# Patient Record
Sex: Female | Born: 1980 | Race: Black or African American | Hispanic: No | Marital: Single | Smoking: Never smoker
Health system: Southern US, Community
[De-identification: ages and names within clinical notes are randomized; demographics above are authoritative.]

## PROBLEM LIST (undated history)

## (undated) DIAGNOSIS — T300 Burn of unspecified body region, unspecified degree: Secondary | ICD-10-CM

## (undated) HISTORY — PX: WISDOM TOOTH EXTRACTION: SHX21

## (undated) HISTORY — PX: TUBAL LIGATION: SHX77

## (undated) HISTORY — PX: OTHER SURGICAL HISTORY: SHX169

---

## 2010-08-07 ENCOUNTER — Emergency Department (HOSPITAL_BASED_OUTPATIENT_CLINIC_OR_DEPARTMENT_OTHER)
Admission: EM | Admit: 2010-08-07 | Discharge: 2010-08-07 | Disposition: A | Discharge status: Home / Self Care | Payer: Self-pay | Attending: Emergency Medicine | Admitting: Emergency Medicine

## 2010-08-07 DIAGNOSIS — R21 Rash and other nonspecific skin eruption: Secondary | ICD-10-CM | POA: Insufficient documentation

## 2010-08-07 DIAGNOSIS — S30860A Insect bite (nonvenomous) of lower back and pelvis, initial encounter: Secondary | ICD-10-CM | POA: Insufficient documentation

## 2010-08-07 DIAGNOSIS — W57XXXA Bitten or stung by nonvenomous insect and other nonvenomous arthropods, initial encounter: Secondary | ICD-10-CM | POA: Insufficient documentation

## 2012-07-07 ENCOUNTER — Encounter (HOSPITAL_COMMUNITY): Payer: Self-pay | Admitting: Emergency Medicine

## 2012-07-07 ENCOUNTER — Emergency Department (HOSPITAL_COMMUNITY)
Admission: EM | Admit: 2012-07-07 | Discharge: 2012-07-07 | Disposition: A | Discharge status: Home / Self Care | Payer: BC Managed Care – PPO | Attending: Emergency Medicine | Admitting: Emergency Medicine

## 2012-07-07 DIAGNOSIS — X12XXXA Contact with other hot fluids, initial encounter: Secondary | ICD-10-CM | POA: Insufficient documentation

## 2012-07-07 DIAGNOSIS — Y93G3 Activity, cooking and baking: Secondary | ICD-10-CM | POA: Insufficient documentation

## 2012-07-07 DIAGNOSIS — T2121XA Burn of second degree of chest wall, initial encounter: Secondary | ICD-10-CM | POA: Insufficient documentation

## 2012-07-07 DIAGNOSIS — Y929 Unspecified place or not applicable: Secondary | ICD-10-CM | POA: Insufficient documentation

## 2012-07-07 DIAGNOSIS — T300 Burn of unspecified body region, unspecified degree: Secondary | ICD-10-CM

## 2012-07-07 NOTE — ED Notes (Signed)
Last tetanus 32 yo
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
## 2012-07-07 NOTE — ED Provider Notes (Signed)
Medical screening examination/treatment/procedure(s) were performed by non-physician practitioner and as supervising physician I was immediately available for consultation/collaboration.  Salah Burlison L Jacques Fife, MD 07/07/12 1707 

## 2012-07-12 ENCOUNTER — Emergency Department (HOSPITAL_COMMUNITY)
Admission: EM | Admit: 2012-07-12 | Discharge: 2012-07-12 | Disposition: A | Discharge status: Home / Self Care | Payer: BC Managed Care – PPO | Attending: Emergency Medicine | Admitting: Emergency Medicine

## 2012-07-12 ENCOUNTER — Encounter (HOSPITAL_COMMUNITY): Payer: Self-pay | Admitting: *Deleted

## 2012-07-12 DIAGNOSIS — T2121XA Burn of second degree of chest wall, initial encounter: Secondary | ICD-10-CM | POA: Insufficient documentation

## 2012-07-12 DIAGNOSIS — Y929 Unspecified place or not applicable: Secondary | ICD-10-CM | POA: Insufficient documentation

## 2012-07-12 DIAGNOSIS — T300 Burn of unspecified body region, unspecified degree: Secondary | ICD-10-CM

## 2012-07-12 DIAGNOSIS — X088XXA Exposure to other specified smoke, fire and flames, initial encounter: Secondary | ICD-10-CM | POA: Insufficient documentation

## 2012-07-12 DIAGNOSIS — Z79899 Other long term (current) drug therapy: Secondary | ICD-10-CM | POA: Insufficient documentation

## 2012-07-12 DIAGNOSIS — Y93G3 Activity, cooking and baking: Secondary | ICD-10-CM | POA: Insufficient documentation

## 2012-07-12 MED ORDER — HYDROCODONE-ACETAMINOPHEN 5-325 MG PO TABS: ORAL_TABLET | ORAL | Status: DC

## 2012-07-12 MED ORDER — CEPHALEXIN 250 MG PO CAPS
500.0000 mg | ORAL_CAPSULE | Freq: Once | ORAL | Status: AC
  Administered 2012-07-12: 500 mg via ORAL
  Filled 2012-07-12: qty 2

## 2012-07-12 MED ORDER — CEPHALEXIN 500 MG PO CAPS: 500.0000 mg | ORAL_CAPSULE | Freq: Four times a day (QID) | ORAL | Status: DC

## 2012-07-12 MED ORDER — SILVER SULFADIAZINE 1 % EX CREA
TOPICAL_CREAM | Freq: Once | CUTANEOUS | Status: AC
  Administered 2012-07-12: 07:00:00 via TOPICAL
  Filled 2012-07-12: qty 85

## 2012-07-12 MED ORDER — NAPROXEN 500 MG PO TABS: 500.0000 mg | ORAL_TABLET | Freq: Two times a day (BID) | ORAL | Status: DC

## 2012-07-12 MED ORDER — DIPHENHYDRAMINE HCL 25 MG PO TABS: 25.0000 mg | ORAL_TABLET | Freq: Four times a day (QID) | ORAL | Status: DC
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
## 2012-07-12 NOTE — ED Provider Notes (Signed)
Medical screening examination/treatment/procedure(s) were performed by non-physician practitioner and as supervising physician I was immediately available for consultation/collaboration.  Caoilainn Sacks K Valdez Brannan-Rasch, MD 07/12/12 2333 

## 2012-07-12 NOTE — ED Notes (Signed)
Pt sleeping in the waiting room.

## 2012-07-14 ENCOUNTER — Encounter (HOSPITAL_COMMUNITY): Payer: Self-pay | Admitting: *Deleted

## 2012-07-14 ENCOUNTER — Emergency Department (HOSPITAL_COMMUNITY)
Admission: EM | Admit: 2012-07-14 | Discharge: 2012-07-14 | Disposition: A | Discharge status: Home / Self Care | Payer: BC Managed Care – PPO | Attending: Emergency Medicine | Admitting: Emergency Medicine

## 2012-07-14 DIAGNOSIS — Z5189 Encounter for other specified aftercare: Secondary | ICD-10-CM

## 2012-07-14 DIAGNOSIS — Z4800 Encounter for change or removal of nonsurgical wound dressing: Secondary | ICD-10-CM | POA: Insufficient documentation

## 2012-07-14 DIAGNOSIS — Z87828 Personal history of other (healed) physical injury and trauma: Secondary | ICD-10-CM | POA: Insufficient documentation

## 2012-07-14 DIAGNOSIS — B3789 Other sites of candidiasis: Secondary | ICD-10-CM | POA: Insufficient documentation

## 2012-07-14 DIAGNOSIS — R21 Rash and other nonspecific skin eruption: Secondary | ICD-10-CM | POA: Insufficient documentation

## 2012-07-14 HISTORY — DX: Burn of unspecified body region, unspecified degree: T30.0

## 2012-07-14 MED ORDER — NYSTATIN 100000 UNIT/GM EX POWD: Freq: Four times a day (QID) | CUTANEOUS | Status: DC

## 2012-07-14 MED ORDER — BACITRACIN ZINC 500 UNIT/GM EX OINT: TOPICAL_OINTMENT | Freq: Two times a day (BID) | CUTANEOUS | Status: DC

## 2012-07-14 MED ORDER — FLUCONAZOLE 100 MG PO TABS
200.0000 mg | ORAL_TABLET | Freq: Once | ORAL | Status: AC
  Administered 2012-07-14: 200 mg via ORAL
  Filled 2012-07-14: qty 2

## 2012-07-14 MED ORDER — CLINDAMYCIN HCL 150 MG PO CAPS
600.0000 mg | ORAL_CAPSULE | Freq: Once | ORAL | Status: AC
  Administered 2012-07-14: 600 mg via ORAL
  Filled 2012-07-14: qty 4
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
## 2012-07-14 NOTE — ED Notes (Signed)
Pt discharged.Vital signs stable and GCS 15 
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
## 2012-07-15 NOTE — ED Provider Notes (Signed)
Medical screening examination/treatment/procedure(s) were performed by non-physician practitioner and as supervising physician I was immediately available for consultation/collaboration.   Dione Booze, MD 07/15/12 [phone]

## 2012-07-16 ENCOUNTER — Encounter (HOSPITAL_COMMUNITY): Payer: Self-pay | Admitting: Emergency Medicine

## 2012-07-16 ENCOUNTER — Emergency Department (HOSPITAL_COMMUNITY)
Admission: EM | Admit: 2012-07-16 | Discharge: 2012-07-16 | Disposition: A | Discharge status: Home / Self Care | Payer: BC Managed Care – PPO | Attending: Emergency Medicine | Admitting: Emergency Medicine

## 2012-07-16 DIAGNOSIS — Z79899 Other long term (current) drug therapy: Secondary | ICD-10-CM | POA: Insufficient documentation

## 2012-07-16 DIAGNOSIS — T300 Burn of unspecified body region, unspecified degree: Secondary | ICD-10-CM

## 2012-07-16 DIAGNOSIS — T2121XA Burn of second degree of chest wall, initial encounter: Secondary | ICD-10-CM | POA: Insufficient documentation

## 2012-07-16 DIAGNOSIS — Y929 Unspecified place or not applicable: Secondary | ICD-10-CM | POA: Insufficient documentation

## 2012-07-16 DIAGNOSIS — L538 Other specified erythematous conditions: Secondary | ICD-10-CM | POA: Insufficient documentation

## 2012-07-16 DIAGNOSIS — Y999 Unspecified external cause status: Secondary | ICD-10-CM | POA: Insufficient documentation

## 2012-07-16 DIAGNOSIS — X19XXXA Contact with other heat and hot substances, initial encounter: Secondary | ICD-10-CM | POA: Insufficient documentation

## 2012-07-16 NOTE — ED Provider Notes (Signed)
Medical screening examination/treatment/procedure(s) were performed by non-physician practitioner and as supervising physician I was immediately available for consultation/collaboration.   Ailana Cuadrado, MD 07/16/12 2214 
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
## 2012-07-17 ENCOUNTER — Encounter (HOSPITAL_COMMUNITY): Payer: Self-pay | Admitting: Cardiology

## 2012-07-17 ENCOUNTER — Emergency Department (HOSPITAL_COMMUNITY)
Admission: EM | Admit: 2012-07-17 | Discharge: 2012-07-17 | Disposition: A | Discharge status: Home / Self Care | Payer: BC Managed Care – PPO | Attending: Emergency Medicine | Admitting: Emergency Medicine

## 2012-07-17 DIAGNOSIS — L299 Pruritus, unspecified: Secondary | ICD-10-CM

## 2012-07-17 DIAGNOSIS — Z87828 Personal history of other (healed) physical injury and trauma: Secondary | ICD-10-CM | POA: Insufficient documentation

## 2012-07-17 LAB — HEPATIC FUNCTION PANEL
Alkaline Phosphatase: 70 U/L (ref 39–117)
Total Protein: 7.5 g/dL (ref 6.0–8.3)

## 2012-07-17 MED ORDER — HYDROXYZINE HCL 50 MG PO TABS: 25.0000 mg | ORAL_TABLET | Freq: Two times a day (BID) | ORAL | Status: DC

## 2012-07-17 NOTE — ED Notes (Signed)
PA at bedside.
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
## 2012-07-17 NOTE — ED Notes (Signed)
Phlebotomy at bedside.
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
## 2012-07-20 NOTE — ED Provider Notes (Signed)
Medical screening examination/treatment/procedure(s) were performed by non-physician practitioner and as supervising physician I was immediately available for consultation/collaboration.   Charles B. Bernette Mayers, MD 07/20/12 1304

## 2013-02-17 ENCOUNTER — Emergency Department (HOSPITAL_COMMUNITY)
Admission: EM | Admit: 2013-02-17 | Discharge: 2013-02-17 | Disposition: A | Discharge status: Home / Self Care | Payer: BC Managed Care – PPO | Attending: Emergency Medicine | Admitting: Emergency Medicine

## 2013-02-17 ENCOUNTER — Encounter (HOSPITAL_COMMUNITY): Payer: Self-pay | Admitting: Emergency Medicine

## 2013-02-17 DIAGNOSIS — J111 Influenza due to unidentified influenza virus with other respiratory manifestations: Secondary | ICD-10-CM | POA: Insufficient documentation

## 2013-02-17 DIAGNOSIS — R112 Nausea with vomiting, unspecified: Secondary | ICD-10-CM | POA: Insufficient documentation

## 2013-02-17 DIAGNOSIS — R197 Diarrhea, unspecified: Secondary | ICD-10-CM | POA: Insufficient documentation

## 2013-02-17 DIAGNOSIS — Z79899 Other long term (current) drug therapy: Secondary | ICD-10-CM | POA: Insufficient documentation

## 2013-02-17 LAB — POCT I-STAT, CHEM 8
Calcium, Ion: 1.17 mmol/L (ref 1.12–1.23)
Chloride: 102 mEq/L (ref 96–112)
HCT: 43 % (ref 36.0–46.0)
Hemoglobin: 14.6 g/dL (ref 12.0–15.0)

## 2013-02-17 LAB — URINE MICROSCOPIC-ADD ON

## 2013-02-17 LAB — URINALYSIS, ROUTINE W REFLEX MICROSCOPIC
Ketones, ur: NEGATIVE mg/dL
Nitrite: NEGATIVE
Specific Gravity, Urine: 1.014 (ref 1.005–1.030)
pH: 5.5 (ref 5.0–8.0)

## 2013-02-17 MED ORDER — SODIUM CHLORIDE 0.9 % IV BOLUS (SEPSIS)
1000.0000 mL | Freq: Once | INTRAVENOUS | Status: AC
  Administered 2013-02-17: 1000 mL via INTRAVENOUS

## 2013-02-17 MED ORDER — BENZONATATE 100 MG PO CAPS: 200.0000 mg | ORAL_CAPSULE | Freq: Two times a day (BID) | ORAL | Status: DC | PRN

## 2013-02-17 MED ORDER — OSELTAMIVIR PHOSPHATE 75 MG PO CAPS: 75.0000 mg | ORAL_CAPSULE | Freq: Two times a day (BID) | ORAL | Status: DC

## 2013-02-17 MED ORDER — KETOROLAC TROMETHAMINE 30 MG/ML IJ SOLN: 30.0000 mg | Freq: Once | INTRAMUSCULAR | Status: DC

## 2013-02-17 NOTE — ED Notes (Signed)
Fever with general malaise since yesterday. Has cough with chest wall pain.

## 2013-02-17 NOTE — ED Provider Notes (Signed)
Medical screening examination/treatment/procedure(s) were performed by non-physician practitioner and as supervising physician I was immediately available for consultation/collaboration.  EKG Interpretation   None        Jeremaine Maraj T Pinkie Manger, MD 02/17/13 2352 
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
## 2013-02-19 LAB — URINE CULTURE

## 2013-02-20 ENCOUNTER — Telehealth (HOSPITAL_COMMUNITY): Payer: Self-pay | Admitting: Emergency Medicine

## 2013-02-20 NOTE — Progress Notes (Signed)
ED Antimicrobial Stewardship Positive Culture Follow Up   Chelsey Wells is an 32 y.o. female who presented to [facility] on 02/17/2013 with a chief complaint of  Chief Complaint  Patient presents with  . Cough    Recent Results (from the past 720 hour(s))  URINE CULTURE     Status: None   Collection Time    02/17/13  9:22 PM      Result Value Range Status   Specimen Description URINE, CLEAN CATCH   Final   Special Requests NONE   Final   Culture  Setup Time     Final   Value: 02/18/2013 01:44     Performed at [facility] Count     Final   Value: >=100,000 COLONIES/ML     Performed at [facility]   Culture     Final   Value: ESCHERICHIA COLI     Performed at [facility]   Report Status 02/19/2013 FINAL   Final   Organism ID, Bacteria ESCHERICHIA COLI   Final    [x]  Patient discharged originally without antimicrobial agent and treatment may now be indicated  Flow manager will attempt to contact patient for a symptom check. If she does not have UTI symptoms, no treatment is needed. If she has symptoms of UTI, start the prescription below.  New antibiotic prescription: Keflex 500mg  PO TID x 5 days  ED Provider: Trixie Dredge, PA-C   Sallee Provencal 02/20/2013, 11:48 AM Infectious Diseases Pharmacist Phone# [phone]

## 2013-02-20 NOTE — ED Notes (Signed)
Post ED Visit - Positive Culture Follow-up: Successful Patient Follow-Up  Culture assessed and recommendations reviewed by: []  Wes Dulaney, Pharm.D., BCPS []  Celedonio Miyamoto, Pharm.D., BCPS []  Georgina Pillion, Pharm.D., BCPS []  [place], [address].D., BCPS, AAHIVP [x]  Estella Husk, Pharm.D., BCPS, AAHIVP  Positive urine culture  [x]  Patient discharged without antimicrobial prescription and treatment is now indicated []  Organism is resistant to prescribed ED discharge antimicrobial []  Patient with positive blood cultures  Changes discussed with ED provider: Trixie Dredge PA-C New antibiotic prescription: if symptomatic, Keflex 500 mg PO TID x 5 days    [place], Jenel Lucks 02/20/2013, 1:00 PM

## 2013-05-30 ENCOUNTER — Encounter (HOSPITAL_COMMUNITY): Payer: Self-pay | Admitting: Emergency Medicine

## 2013-05-30 ENCOUNTER — Emergency Department (HOSPITAL_COMMUNITY)
Admission: EM | Admit: 2013-05-30 | Discharge: 2013-05-31 | Disposition: A | Discharge status: Home / Self Care | Payer: BC Managed Care – PPO | Attending: Emergency Medicine | Admitting: Emergency Medicine

## 2013-05-30 DIAGNOSIS — G43909 Migraine, unspecified, not intractable, without status migrainosus: Secondary | ICD-10-CM

## 2013-05-30 DIAGNOSIS — N390 Urinary tract infection, site not specified: Secondary | ICD-10-CM

## 2013-05-30 DIAGNOSIS — R112 Nausea with vomiting, unspecified: Secondary | ICD-10-CM | POA: Insufficient documentation

## 2013-05-30 DIAGNOSIS — H53149 Visual discomfort, unspecified: Secondary | ICD-10-CM | POA: Insufficient documentation

## 2013-05-30 DIAGNOSIS — H93239 Hyperacusis, unspecified ear: Secondary | ICD-10-CM | POA: Insufficient documentation

## 2013-05-30 MED ORDER — SODIUM CHLORIDE 0.9 % IV SOLN
1000.0000 mL | Freq: Once | INTRAVENOUS | Status: AC
  Administered 2013-05-31: 1000 mL via INTRAVENOUS

## 2013-05-30 MED ORDER — ONDANSETRON HCL 4 MG/2ML IJ SOLN
4.0000 mg | Freq: Once | INTRAMUSCULAR | Status: AC
  Administered 2013-05-31: 4 mg via INTRAVENOUS
  Filled 2013-05-30: qty 2

## 2013-05-30 NOTE — ED Notes (Addendum)
Pt reports a HA x3 days since Thursday afternoon, reports hx of HAs but this pain is different, pt has vomited, pressure behind her R eye, sensitivity to light and sounds, reports taking Bayer migraine without relief, family hx of migraines. Denies blurry vision. Pt a&o x4, skin warm and dry, ambulatory to triage.

## 2013-05-30 NOTE — ED Notes (Signed)
Bed: [id number] Expected date:  Expected time:  Means of arrival:  Comments: Tr 3

## 2013-05-31 LAB — COMPREHENSIVE METABOLIC PANEL
ALK PHOS: 75 U/L (ref 39–117)
ALT: 11 U/L (ref 0–35)
AST: 21 U/L (ref 0–37)
Albumin: 3.6 g/dL (ref 3.5–5.2)
BILIRUBIN TOTAL: 0.3 mg/dL (ref 0.3–1.2)
BUN: 10 mg/dL (ref 6–23)
CHLORIDE: 102 meq/L (ref 96–112)
CO2: 24 mEq/L (ref 19–32)
CREATININE: 0.9 mg/dL (ref 0.50–1.10)
Calcium: 9.3 mg/dL (ref 8.4–10.5)
GFR, EST NON AFRICAN AMERICAN: 82 mL/min — AB (ref >90)
GLUCOSE: 84 mg/dL (ref 70–99)
POTASSIUM: 3.7 meq/L (ref 3.7–5.3)
Sodium: 137 mEq/L (ref 137–147)
Total Protein: 7.7 g/dL (ref 6.0–8.3)

## 2013-05-31 LAB — CBC WITH DIFFERENTIAL/PLATELET
BASOS ABS: 0.1 10*3/uL (ref 0.0–0.1)
Basophils Relative: 1 % (ref 0–1)
EOS PCT: 1 % (ref 0–5)
Eosinophils Absolute: 0.1 10*3/uL (ref 0.0–0.7)
HCT: 40.5 % (ref 36.0–46.0)
Hemoglobin: 13.7 g/dL (ref 12.0–15.0)
LYMPHS PCT: 54 % — AB (ref 12–46)
Lymphs Abs: 3.5 10*3/uL (ref 0.7–4.0)
MCH: 28.8 pg (ref 26.0–34.0)
MCHC: 33.8 g/dL (ref 30.0–36.0)
MCV: 85.3 fL (ref 78.0–100.0)
Monocytes Absolute: 0.4 10*3/uL (ref 0.1–1.0)
Monocytes Relative: 7 % (ref 3–12)
NEUTROS ABS: 2.5 10*3/uL (ref 1.7–7.7)
NEUTROS PCT: 38 % — AB (ref 43–77)
PLATELETS: 294 10*3/uL (ref 150–400)
RBC: 4.75 MIL/uL (ref 3.87–5.11)
RDW: 13.1 % (ref 11.5–15.5)
WBC: 6.5 10*3/uL (ref 4.0–10.5)

## 2013-05-31 LAB — URINALYSIS, ROUTINE W REFLEX MICROSCOPIC
BILIRUBIN URINE: NEGATIVE
GLUCOSE, UA: NEGATIVE mg/dL
HGB URINE DIPSTICK: NEGATIVE
Ketones, ur: NEGATIVE mg/dL
Nitrite: POSITIVE — AB
Protein, ur: NEGATIVE mg/dL
Specific Gravity, Urine: 1.024 (ref 1.005–1.030)
UROBILINOGEN UA: 1 mg/dL (ref 0.0–1.0)
pH: 6 (ref 5.0–8.0)

## 2013-05-31 LAB — LIPASE, BLOOD: Lipase: 30 U/L (ref 11–59)

## 2013-05-31 LAB — URINE MICROSCOPIC-ADD ON

## 2013-05-31 MED ORDER — DIPHENHYDRAMINE HCL 50 MG/ML IJ SOLN
25.0000 mg | Freq: Once | INTRAMUSCULAR | Status: AC
  Administered 2013-05-31: 25 mg via INTRAVENOUS
  Filled 2013-05-31: qty 1

## 2013-05-31 MED ORDER — PROCHLORPERAZINE EDISYLATE 5 MG/ML IJ SOLN
10.0000 mg | Freq: Once | INTRAMUSCULAR | Status: AC
  Administered 2013-05-31: 10 mg via INTRAVENOUS
  Filled 2013-05-31: qty 2

## 2013-05-31 MED ORDER — NITROFURANTOIN MONOHYD MACRO 100 MG PO CAPS: 100.0000 mg | ORAL_CAPSULE | Freq: Two times a day (BID) | ORAL | Status: DC

## 2013-05-31 MED ORDER — KETOROLAC TROMETHAMINE 30 MG/ML IJ SOLN
30.0000 mg | Freq: Once | INTRAMUSCULAR | Status: AC
  Administered 2013-05-31: 30 mg via INTRAVENOUS
  Filled 2013-05-31: qty 1
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
## 2013-05-31 NOTE — Discharge Instructions (Signed)
Return here as needed.  Follow-up with a primary care Dr. increase your fluid intake °

## 2013-06-01 NOTE — ED Provider Notes (Signed)
Medical screening examination/treatment/procedure(s) were performed by non-physician practitioner and as supervising physician I was immediately available for consultation/collaboration.   EKG Interpretation None       Derwood KaplanAnkit Genevra Orne, MD 06/01/13 1009

## 2013-08-02 ENCOUNTER — Encounter (HOSPITAL_COMMUNITY): Payer: Self-pay | Admitting: Emergency Medicine

## 2013-08-02 ENCOUNTER — Emergency Department (HOSPITAL_COMMUNITY)
Admission: EM | Admit: 2013-08-02 | Discharge: 2013-08-02 | Disposition: A | Discharge status: Home / Self Care | Payer: BC Managed Care – PPO | Attending: Emergency Medicine | Admitting: Emergency Medicine

## 2013-08-02 DIAGNOSIS — M79609 Pain in unspecified limb: Secondary | ICD-10-CM

## 2013-08-02 DIAGNOSIS — Z9889 Other specified postprocedural states: Secondary | ICD-10-CM | POA: Insufficient documentation

## 2013-08-02 DIAGNOSIS — Z87828 Personal history of other (healed) physical injury and trauma: Secondary | ICD-10-CM | POA: Insufficient documentation

## 2013-08-02 DIAGNOSIS — M79604 Pain in right leg: Secondary | ICD-10-CM

## 2013-08-02 DIAGNOSIS — Z7982 Long term (current) use of aspirin: Secondary | ICD-10-CM | POA: Insufficient documentation

## 2013-08-02 MED ORDER — KETOROLAC TROMETHAMINE 60 MG/2ML IM SOLN
30.0000 mg | Freq: Once | INTRAMUSCULAR | Status: AC
  Administered 2013-08-02: 30 mg via INTRAMUSCULAR
  Filled 2013-08-02: qty 2

## 2013-08-02 MED ORDER — HYDROCODONE-ACETAMINOPHEN 5-325 MG PO TABS: ORAL_TABLET | ORAL | Status: DC
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
## 2013-08-02 NOTE — Discharge Instructions (Signed)
Rest, Ice intermittently (in the first 24-48 hours), Gentle compression with an Ace wrap, and elevate (Limb above the level of the heart)   Take up to 800mg  of ibuprofen (that is usually 4 over the counter pills)  3 times a day for 5 days. Take with food.  Take vicodin for breakthrough pain, do not drink alcohol, drive, care for children or do other critical tasks while taking vicodin.  Do not hesitate to return to the Emergency Department for any new, worsening or concerning symptoms.   If you do not have a primary care doctor you can establish one at the   [facility]: [address] [place] [address] [phone]  After you establish care. Let them know you were seen in the emergency room. They must obtain records for further management.    Musculoskeletal Pain Musculoskeletal pain is muscle and boney aches and pains. These pains can occur in any part of the body. Your caregiver may treat you without knowing the cause of the pain. They may treat you if blood or urine tests, X-rays, and other tests were normal.  CAUSES There is often not a definite cause or reason for these pains. These pains may be caused by a type of germ (virus). The discomfort may also come from overuse. Overuse includes working out too hard when your body is not fit. Boney aches also come from weather changes. Bone is sensitive to atmospheric pressure changes. HOME CARE INSTRUCTIONS   Ask when your test results will be ready. Make sure you get your test results.  Only take over-the-counter or prescription medicines for pain, discomfort, or fever as directed by your caregiver. If you were given medications for your condition, do not drive, operate machinery or power tools, or sign legal documents for 24 hours. Do not drink alcohol. Do not take sleeping pills or other medications that may interfere with treatment.  Continue all activities unless the activities cause more pain. When the pain  lessens, slowly resume normal activities. Gradually increase the intensity and duration of the activities or exercise.  During periods of severe pain, bed rest may be helpful. Lay or sit in any position that is comfortable.  Putting ice on the injured area.  Put ice in a bag.  Place a towel between your skin and the bag.  Leave the ice on for 15 to 20 minutes, 3 to 4 times a day.  Follow up with your caregiver for continued problems and no reason can be found for the pain. If the pain becomes worse or does not go away, it may be necessary to repeat tests or do additional testing. Your caregiver may need to look further for a possible cause. SEEK IMMEDIATE MEDICAL CARE IF:  You have pain that is getting worse and is not relieved by medications.  You develop chest pain that is associated with shortness or breath, sweating, feeling sick to your stomach (nauseous), or throw up (vomit).  Your pain becomes localized to the abdomen.  You develop any new symptoms that seem different or that concern you. MAKE SURE YOU:   Understand these instructions.  Will watch your condition.  Will get help right away if you are not doing well or get worse. Document Released: 02/11/2005 Document Revised: 05/06/2011 Document Reviewed: 10/16/2012 [facility] Patient Information 2014 [place], [place].

## 2013-08-02 NOTE — ED Notes (Signed)
Pt c/o right lower leg pain x 1 week, denies injuries, hx of blood clots or SOB.

## 2013-08-02 NOTE — ED Provider Notes (Signed)
Medical screening examination/treatment/procedure(s) were performed by non-physician practitioner and as supervising physician I was immediately available for consultation/collaboration.  Flint Melter, MD 08/02/13 [phone]

## 2013-08-02 NOTE — Progress Notes (Signed)
VASCULAR LAB PRELIMINARY  PRELIMINARY  PRELIMINARY  PRELIMINARY  Right lower extremity venous duplex completed.    Preliminary report:  Right:  No evidence of DVT, superficial thrombosis, or Baker's cyst.  Gara Kroner, RVT 08/02/2013, 9:48 AM

## 2014-02-28 ENCOUNTER — Ambulatory Visit (INDEPENDENT_AMBULATORY_CARE_PROVIDER_SITE_OTHER): Payer: BLUE CROSS/BLUE SHIELD

## 2014-02-28 ENCOUNTER — Encounter: Payer: Self-pay | Admitting: Podiatry

## 2014-02-28 ENCOUNTER — Ambulatory Visit (INDEPENDENT_AMBULATORY_CARE_PROVIDER_SITE_OTHER): Payer: BLUE CROSS/BLUE SHIELD | Admitting: Podiatry

## 2014-02-28 VITALS — BP 118/65 | HR 74 | Resp 13 | Ht <= 58 in | Wt 164.0 lb

## 2014-02-28 DIAGNOSIS — M79661 Pain in right lower leg: Secondary | ICD-10-CM

## 2014-02-28 DIAGNOSIS — M21371 Foot drop, right foot: Secondary | ICD-10-CM

## 2014-02-28 DIAGNOSIS — R269 Unspecified abnormalities of gait and mobility: Secondary | ICD-10-CM

## 2014-02-28 DIAGNOSIS — L84 Corns and callosities: Secondary | ICD-10-CM

## 2014-02-28 DIAGNOSIS — Q72891 Other reduction defects of right lower limb: Secondary | ICD-10-CM

## 2014-02-28 NOTE — Progress Notes (Signed)
   Subjective:    Patient ID: Chelsey Wells, female    DOB: 1980-08-26, 34 y.o.   MRN: [id number]  HPI Comments: N callouses L right 1st toe and plantar sub 4, 5th toe D and O long-term C hard painful skin A walking  T hx of pedicures on and off as needed  Pt complains of burning pain in right shin and request referral to an orthopedic doctor.  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 She describes approximately nine operations to the lower leg ankle and foot on the right. She has worn shoe lifts, AFOs, however, now has an over-the-counter soft heel cushion lift in her right shoe. 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  All other systems reviewed and are negative.      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 Keratoses plantar right hallux, lateral fifth right toe Plantar keratoses first and third MPJ left  Musculoskeletal: Right lower leg is smaller than left Right lower extremity shorter than left 10 mm left placed on the right foot provide discomfort to patient Manual motor testing: Plantar flexion right 4/5 left 5/5 Dorsi flexion right 0/5 left 5/5 Inversion right 0/5 left 5/5 Eversion right 0/5 left 5/5  No motion in right subtalar joint, no restriction left subtalar joint Right calcaneocuboid joint appears fused no restriction left subtalar joint Able manually dorsiflex right ankle without obvious restriction or crepitus Patient  seems to have a relatively stable gait  X-ray examination right foot  Intact bony structure without fracture and/or dislocation Medial transverse drifting of the lateral digits Internal fixation hallux, first metatarsal, talonavicular, calcaneocuboid Apparent fusion of subtalar joint and calcaneocuboid joint and interphalangeal joint of the hallux Bone resection fifth toe  Radiographic impression: Evidence of surgical fusion subtalar joint, calcaneocuboid joint, hallux interphalangeal joint appear well-healed    X-ray examination right ankle  Intact bony structure without fracture or dislocation Retained internal fixation talonavicular calcaneal cuboid area and first metatarsal Arthrodesis of the subtalar and calcaneocuboid joints Ankle joint is visible with dorsal spurring on the head and neck of the talus  Radiographic impression right ankle: Ankle joint is visible without obvious arthritic changes Evidence of rear foot fusion of the subtalar and calcaneocuboid joints     Assessment & Plan:   Assessment: 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 We did discuss AFO device, however, patient was not interested in this type of device Return as needed for debridement and further evaluation at patient's request

## 2014-07-13 ENCOUNTER — Emergency Department (HOSPITAL_COMMUNITY): Payer: BLUE CROSS/BLUE SHIELD

## 2014-07-13 ENCOUNTER — Emergency Department (HOSPITAL_COMMUNITY)
Admission: EM | Admit: 2014-07-13 | Discharge: 2014-07-13 | Disposition: A | Discharge status: Home / Self Care | Payer: BLUE CROSS/BLUE SHIELD | Attending: Emergency Medicine | Admitting: Emergency Medicine

## 2014-07-13 ENCOUNTER — Encounter (HOSPITAL_COMMUNITY): Payer: Self-pay

## 2014-07-13 DIAGNOSIS — Z3202 Encounter for pregnancy test, result negative: Secondary | ICD-10-CM | POA: Diagnosis not present

## 2014-07-13 DIAGNOSIS — Z87828 Personal history of other (healed) physical injury and trauma: Secondary | ICD-10-CM | POA: Insufficient documentation

## 2014-07-13 DIAGNOSIS — R079 Chest pain, unspecified: Secondary | ICD-10-CM | POA: Diagnosis not present

## 2014-07-13 DIAGNOSIS — Z7982 Long term (current) use of aspirin: Secondary | ICD-10-CM | POA: Insufficient documentation

## 2014-07-13 DIAGNOSIS — M79606 Pain in leg, unspecified: Secondary | ICD-10-CM | POA: Insufficient documentation

## 2014-07-13 DIAGNOSIS — R110 Nausea: Secondary | ICD-10-CM | POA: Diagnosis not present

## 2014-07-13 DIAGNOSIS — R0602 Shortness of breath: Secondary | ICD-10-CM | POA: Diagnosis not present

## 2014-07-13 LAB — CBC
HEMATOCRIT: 41.7 % (ref 36.0–46.0)
Hemoglobin: 13.8 g/dL (ref 12.0–15.0)
MCH: 27.9 pg (ref 26.0–34.0)
MCHC: 33.1 g/dL (ref 30.0–36.0)
MCV: 84.2 fL (ref 78.0–100.0)
PLATELETS: 294 10*3/uL (ref 150–400)
RBC: 4.95 MIL/uL (ref 3.87–5.11)
RDW: 13.2 % (ref 11.5–15.5)
WBC: 6.1 10*3/uL (ref 4.0–10.5)

## 2014-07-13 LAB — I-STAT TROPONIN, ED
TROPONIN I, POC: 0 ng/mL (ref 0.00–0.08)
Troponin i, poc: 0 ng/mL (ref 0.00–0.08)

## 2014-07-13 LAB — BASIC METABOLIC PANEL
Anion gap: 10 (ref 5–15)
BUN: 13 mg/dL (ref 6–20)
CHLORIDE: 104 mmol/L (ref 101–111)
CO2: 25 mmol/L (ref 22–32)
Calcium: 9.3 mg/dL (ref 8.9–10.3)
Creatinine, Ser: 0.95 mg/dL (ref 0.44–1.00)
GFR calc non Af Amer: >60 mL/min (ref >60)
Glucose, Bld: 76 mg/dL (ref 65–99)
POTASSIUM: 3.9 mmol/L (ref 3.5–5.1)
Sodium: 139 mmol/L (ref 135–145)

## 2014-07-13 LAB — URINALYSIS, ROUTINE W REFLEX MICROSCOPIC
BILIRUBIN URINE: NEGATIVE
Glucose, UA: NEGATIVE mg/dL
Hgb urine dipstick: NEGATIVE
KETONES UR: NEGATIVE mg/dL
Leukocytes, UA: NEGATIVE
NITRITE: POSITIVE — AB
PH: 6.5 (ref 5.0–8.0)
PROTEIN: NEGATIVE mg/dL
Specific Gravity, Urine: 1.023 (ref 1.005–1.030)
Urobilinogen, UA: 2 mg/dL — ABNORMAL HIGH (ref 0.0–1.0)

## 2014-07-13 LAB — URINE MICROSCOPIC-ADD ON

## 2014-07-13 LAB — D-DIMER, QUANTITATIVE (NOT AT ARMC): D-Dimer, Quant: 0.43 ug/mL-FEU (ref 0.00–0.48)

## 2014-07-13 LAB — POC URINE PREG, ED: Preg Test, Ur: NEGATIVE

## 2014-07-13 MED ORDER — KETOROLAC TROMETHAMINE 30 MG/ML IJ SOLN
30.0000 mg | Freq: Once | INTRAMUSCULAR | Status: AC
  Administered 2014-07-13: 30 mg via INTRAVENOUS
  Filled 2014-07-13: qty 1

## 2014-07-13 MED ORDER — GI COCKTAIL ~~LOC~~
30.0000 mL | Freq: Once | ORAL | Status: AC
  Administered 2014-07-13: 30 mL via ORAL
  Filled 2014-07-13: qty 30

## 2014-07-13 NOTE — ED Notes (Signed)
Pt presents with c/o central chest pain that started this morning, tightness in nature. Pt reports she has had a hx of this before, has had to wear a heart monitor in the past. Pt also c/o left leg pain, denies any injury.
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
## 2014-07-13 NOTE — Discharge Instructions (Signed)

## 2014-11-02 ENCOUNTER — Ambulatory Visit (INDEPENDENT_AMBULATORY_CARE_PROVIDER_SITE_OTHER): Payer: BLUE CROSS/BLUE SHIELD | Admitting: Podiatry

## 2014-11-02 ENCOUNTER — Encounter: Payer: Self-pay | Admitting: Podiatry

## 2014-11-02 VITALS — BP 112/70 | HR 99 | Ht <= 58 in | Wt 172.0 lb

## 2014-11-02 DIAGNOSIS — M79673 Pain in unspecified foot: Secondary | ICD-10-CM

## 2014-11-02 DIAGNOSIS — M79606 Pain in leg, unspecified: Secondary | ICD-10-CM | POA: Insufficient documentation

## 2014-11-02 DIAGNOSIS — M21969 Unspecified acquired deformity of unspecified lower leg: Secondary | ICD-10-CM | POA: Diagnosis not present

## 2014-11-02 DIAGNOSIS — M205X9 Other deformities of toe(s) (acquired), unspecified foot: Secondary | ICD-10-CM

## 2014-11-02 DIAGNOSIS — Q828 Other specified congenital malformations of skin: Secondary | ICD-10-CM | POA: Insufficient documentation

## 2014-11-02 DIAGNOSIS — M205X1 Other deformities of toe(s) (acquired), right foot: Secondary | ICD-10-CM

## 2014-11-02 DIAGNOSIS — M79604 Pain in right leg: Secondary | ICD-10-CM

## 2014-11-02 NOTE — Progress Notes (Signed)
SUBJECTIVE: 34 y.o. year old female presents stating that she was having problem with right foot all her life. This time the foot has been hurting for a month. Pain on the tip of right great toe and side of the 5th MPJ area right foot where hard callus like tissue build up.  Stated that she has had lengthening procedure on 5th toe right in 2007. She has a pair of shoe inserts that are not providing sufficient support. She feels she need something more sturdy.    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   OBJECTIVE: DERMATOLOGIC EXAMINATION: Nails: normal appearing nails bilaterally Positive of thick callus like build up under the 5th MPJ area right foot.  Distal end of right hallux with thick callused skin.  Positive of multiple scar over right foot from old surgeries.  VASCULAR EXAMINATION OF LOWER LIMBS: Pedal pulses: All pedal pulses are palpable with normal pulsation.  Temperature gradient from tibial crest to dorsum of foot is within normal bilateral. No edema or erythema noted. NEUROLOGIC EXAMINATION OF THE LOWER LIMBS: Achilles DTR is present and within normal. All epicritic and tactile sensations grossly intact. Sharp and Dull discriminatory sensations at the plantar ball of hallux is intact bilateral.  MUSCULOSKELETAL EXAMINATION: Positive for plantar grading right hallux with dorsally displaced first MPJ right foot.  Excess lateral weight shifting right foot.  Long right hallux with plantar flexion at the distal end upon weight bearing.  Limited dorsiflexion of the first MPJ right with load bearing.   RADIOGRAPHIC FINDINGS: Right foot has had rearfoot fusion, base wedge osteotomy with screw fixation of the first Metatarsal, first IPJ fusion right hallux.  Elevated first metatarsal on on lateral view with long first metatarsal right foot.  Left foot has  normal findings.  ASSESSMENT: 1. Long and elevated first ray fail to bear load. 2. Extended long right hallux plantar flexion upon weight bearing with build up callus. 3. Lateral weight shifting due to dorsiflexed first ray right.  4. Painful plantar lateral callus right.  5. Hallux limitus right.  PLAN: Reviewed findings and available treatment options. All lesions debrided. Both feet casted for orthotics.

## 2014-12-01 ENCOUNTER — Ambulatory Visit: Payer: BLUE CROSS/BLUE SHIELD | Admitting: Podiatry

## 2014-12-02 ENCOUNTER — Ambulatory Visit: Payer: BLUE CROSS/BLUE SHIELD | Admitting: Podiatry

## 2015-08-08 ENCOUNTER — Encounter (HOSPITAL_COMMUNITY): Payer: Self-pay | Admitting: *Deleted

## 2015-08-08 ENCOUNTER — Emergency Department (HOSPITAL_COMMUNITY)
Admission: EM | Admit: 2015-08-08 | Discharge: 2015-08-08 | Disposition: A | Discharge status: Home / Self Care | Payer: Self-pay | Attending: Emergency Medicine | Admitting: Emergency Medicine

## 2015-08-08 ENCOUNTER — Emergency Department (HOSPITAL_COMMUNITY): Payer: Self-pay

## 2015-08-08 DIAGNOSIS — B349 Viral infection, unspecified: Secondary | ICD-10-CM | POA: Insufficient documentation

## 2015-08-08 LAB — RAPID STREP SCREEN (MED CTR MEBANE ONLY): STREPTOCOCCUS, GROUP A SCREEN (DIRECT): NEGATIVE

## 2015-08-08 MED ORDER — ACETAMINOPHEN 325 MG PO TABS: 650.0000 mg | ORAL_TABLET | Freq: Once | ORAL | Status: DC | PRN

## 2015-08-08 NOTE — Discharge Instructions (Signed)
Viral Infections °A viral infection can be caused by different types of viruses. Most viral infections are not serious and resolve on their own. However, some infections may cause severe symptoms and may lead to further complications. °SYMPTOMS °Viruses can frequently cause: °· Minor sore throat. °· Aches and pains. °· Headaches. °· Runny nose. °· Different types of rashes. °· Watery eyes. °· Tiredness. °· Cough. °· Loss of appetite. °· Gastrointestinal infections, resulting in nausea, vomiting, and diarrhea. °These symptoms do not respond to antibiotics because the infection is not caused by bacteria. However, you might catch a bacterial infection following the viral infection. This is sometimes called a "superinfection." Symptoms of such a bacterial infection may include: °· Worsening sore throat with pus and difficulty swallowing. °· Swollen neck glands. °· Chills and a high or persistent fever. °· Severe headache. °· Tenderness over the sinuses. °· Persistent overall ill feeling (malaise), muscle aches, and tiredness (fatigue). °· Persistent cough. °· Yellow, green, or brown mucus production with coughing. °HOME CARE INSTRUCTIONS  °· Only take over-the-counter or prescription medicines for pain, discomfort, diarrhea, or fever as directed by your caregiver. °· Drink enough water and fluids to keep your urine clear or pale yellow. Sports drinks can provide valuable electrolytes, sugars, and hydration. °· Get plenty of rest and maintain proper nutrition. Soups and broths with crackers or rice are fine. °SEEK IMMEDIATE MEDICAL CARE IF:  °· You have severe headaches, shortness of breath, chest pain, neck pain, or an unusual rash. °· You have uncontrolled vomiting, diarrhea, or you are unable to keep down fluids. °· You or your child has an oral temperature above 102° F (38.9° C), not controlled by medicine. °· Your baby is older than 3 months with a rectal temperature of 102° F (38.9° C) or higher. °· Your baby is 3  months old or younger with a rectal temperature of 100.4° F (38° C) or higher. °MAKE SURE YOU:  °· Understand these instructions. °· Will watch your condition. °· Will get help right away if you are not doing well or get worse. °  °This information is not intended to replace advice given to you by your health care provider. Make sure you discuss any questions you have with your health care provider. °  °Document Released: 11/21/2004 Document Revised: 05/06/2011 Document Reviewed: 07/20/2014 °[employer] Interactive Patient Education ©2016 [employer] ° °
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
## 2015-08-08 NOTE — ED Notes (Signed)
Pt c/o fever 103 and sore throat, she sts completely lost her voice and can hardly whisper; also reports dry cough, non-productive. She sts it feels like something is stuck in her throat.

## 2015-08-10 LAB — CULTURE, GROUP A STREP (THRC)

## 2016-03-11 ENCOUNTER — Ambulatory Visit (HOSPITAL_COMMUNITY)
Admission: EM | Admit: 2016-03-11 | Discharge: 2016-03-11 | Disposition: A | Discharge status: Home / Self Care | Payer: 59 | Attending: Family Medicine | Admitting: Family Medicine

## 2016-03-11 ENCOUNTER — Encounter (HOSPITAL_COMMUNITY): Payer: Self-pay | Admitting: Emergency Medicine

## 2016-03-11 DIAGNOSIS — R6889 Other general symptoms and signs: Secondary | ICD-10-CM | POA: Diagnosis not present

## 2016-03-11 DIAGNOSIS — R197 Diarrhea, unspecified: Secondary | ICD-10-CM

## 2016-03-11 DIAGNOSIS — R52 Pain, unspecified: Secondary | ICD-10-CM

## 2016-03-11 DIAGNOSIS — R110 Nausea: Secondary | ICD-10-CM

## 2016-03-11 MED ORDER — ONDANSETRON HCL 4 MG PO TABS: 4.0000 mg | ORAL_TABLET | Freq: Four times a day (QID) | ORAL | 0 refills | Status: DC

## 2016-03-11 MED ORDER — OSELTAMIVIR PHOSPHATE 75 MG PO CAPS: 75.0000 mg | ORAL_CAPSULE | Freq: Two times a day (BID) | ORAL | 0 refills | Status: DC
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
## 2016-03-11 NOTE — ED Triage Notes (Signed)
Fever, sob, general aches and pain, scratchy throat and temp 102.

## 2016-04-10 ENCOUNTER — Encounter (HOSPITAL_COMMUNITY): Payer: Self-pay | Admitting: Emergency Medicine

## 2016-04-10 ENCOUNTER — Ambulatory Visit (HOSPITAL_COMMUNITY)
Admission: EM | Admit: 2016-04-10 | Discharge: 2016-04-10 | Disposition: A | Discharge status: Home / Self Care | Payer: 59 | Attending: Family Medicine | Admitting: Family Medicine

## 2016-04-10 DIAGNOSIS — J029 Acute pharyngitis, unspecified: Secondary | ICD-10-CM | POA: Insufficient documentation

## 2016-04-10 DIAGNOSIS — R6889 Other general symptoms and signs: Secondary | ICD-10-CM

## 2016-04-10 DIAGNOSIS — R509 Fever, unspecified: Secondary | ICD-10-CM | POA: Diagnosis not present

## 2016-04-10 LAB — POCT RAPID STREP A: Streptococcus, Group A Screen (Direct): NEGATIVE

## 2016-04-10 MED ORDER — OSELTAMIVIR PHOSPHATE 75 MG PO CAPS: 75.0000 mg | ORAL_CAPSULE | Freq: Two times a day (BID) | ORAL | 0 refills | Status: AC

## 2016-04-10 MED ORDER — ACETAMINOPHEN 325 MG PO TABS
975.0000 mg | ORAL_TABLET | Freq: Once | ORAL | Status: AC
  Administered 2016-04-10: 975 mg via ORAL

## 2016-04-10 NOTE — ED Triage Notes (Signed)
[facility] with a complaint of [facility] sore [facility]  And a fever of 102.56F last night. The patient reported a fever of 101.6F today. The patient has not had any antipyretics in the last 6 hours.
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
## 2016-04-12 LAB — CULTURE, GROUP A STREP (THRC)

## 2017-03-16 ENCOUNTER — Emergency Department (HOSPITAL_COMMUNITY)
Admission: EM | Admit: 2017-03-16 | Discharge: 2017-03-16 | Disposition: A | Discharge status: Home / Self Care | Payer: 59 | Attending: Emergency Medicine | Admitting: Emergency Medicine

## 2017-03-16 ENCOUNTER — Encounter (HOSPITAL_COMMUNITY): Payer: Self-pay | Admitting: Emergency Medicine

## 2017-03-16 DIAGNOSIS — L539 Erythematous condition, unspecified: Secondary | ICD-10-CM | POA: Diagnosis present

## 2017-03-16 DIAGNOSIS — K120 Recurrent oral aphthae: Secondary | ICD-10-CM | POA: Insufficient documentation

## 2017-03-16 MED ORDER — LIDOCAINE VISCOUS 2 % MT SOLN: 20.0000 mL | OROMUCOSAL | 0 refills | Status: AC | PRN
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
## 2017-03-16 NOTE — Discharge Instructions (Signed)
Use viscous lidocaine to help with pain.  Use tylenol or ibuprofen for pain.  If symptoms are not improving, follow up with dentistry. There is information about dentists in the area attached in this paperwork. Return to the emergency room if you develop difficulty breathing, fevers, or any new or worsening symptoms.

## 2017-03-16 NOTE — ED Triage Notes (Signed)
Patient c/o pain and minor swelling to left side tongue x2 days. Denies throat swelling and difficulty breathing.

## 2017-08-19 ENCOUNTER — Other Ambulatory Visit: Payer: Self-pay

## 2017-08-19 ENCOUNTER — Emergency Department (HOSPITAL_COMMUNITY): Payer: 59

## 2017-08-19 ENCOUNTER — Emergency Department (HOSPITAL_COMMUNITY)
Admission: EM | Admit: 2017-08-19 | Discharge: 2017-08-19 | Disposition: A | Discharge status: Home / Self Care | Payer: 59 | Attending: Emergency Medicine | Admitting: Emergency Medicine

## 2017-08-19 DIAGNOSIS — R0602 Shortness of breath: Secondary | ICD-10-CM | POA: Diagnosis not present

## 2017-08-19 DIAGNOSIS — R0789 Other chest pain: Secondary | ICD-10-CM | POA: Insufficient documentation

## 2017-08-19 LAB — COMPREHENSIVE METABOLIC PANEL
ALK PHOS: 71 U/L (ref 38–126)
ALT: 12 U/L (ref 0–44)
ANION GAP: 6 (ref 5–15)
AST: 20 U/L (ref 15–41)
Albumin: 3.5 g/dL (ref 3.5–5.0)
BUN: 9 mg/dL (ref 6–20)
CALCIUM: 8.9 mg/dL (ref 8.9–10.3)
CO2: 26 mmol/L (ref 22–32)
CREATININE: 0.74 mg/dL (ref 0.44–1.00)
Chloride: 108 mmol/L (ref 98–111)
GFR calc Af Amer: >60 mL/min (ref >60)
GFR calc non Af Amer: >60 mL/min (ref >60)
Glucose, Bld: 84 mg/dL (ref 70–99)
Potassium: 3.6 mmol/L (ref 3.5–5.1)
SODIUM: 140 mmol/L (ref 135–145)
Total Bilirubin: 0.7 mg/dL (ref 0.3–1.2)
Total Protein: 7.5 g/dL (ref 6.5–8.1)

## 2017-08-19 LAB — CBC WITH DIFFERENTIAL/PLATELET
Basophils Absolute: 0 K/uL (ref 0.0–0.1)
Basophils Relative: 0 %
Eosinophils Absolute: 0.1 K/uL (ref 0.0–0.7)
Eosinophils Relative: 1 %
HCT: 40.8 % (ref 36.0–46.0)
Hemoglobin: 13.3 g/dL (ref 12.0–15.0)
Lymphocytes Relative: 46 %
Lymphs Abs: 3.3 K/uL (ref 0.7–4.0)
MCH: 27.8 pg (ref 26.0–34.0)
MCHC: 32.6 g/dL (ref 30.0–36.0)
MCV: 85.2 fL (ref 78.0–100.0)
Monocytes Absolute: 0.5 K/uL (ref 0.1–1.0)
Monocytes Relative: 6 %
Neutro Abs: 3.3 K/uL (ref 1.7–7.7)
Neutrophils Relative %: 47 %
Platelets: 313 K/uL (ref 150–400)
RBC: 4.79 MIL/uL (ref 3.87–5.11)
RDW: 13.6 % (ref 11.5–15.5)
WBC: 7.1 K/uL (ref 4.0–10.5)

## 2017-08-19 LAB — LIPASE, BLOOD: Lipase: 24 U/L (ref 11–51)

## 2017-08-19 LAB — TROPONIN I: Troponin I: <0.03 ng/mL (ref <0.03)

## 2017-08-19 MED ORDER — LORAZEPAM 2 MG/ML IJ SOLN
1.0000 mg | Freq: Once | INTRAMUSCULAR | Status: AC
  Administered 2017-08-19: 1 mg via INTRAVENOUS
  Filled 2017-08-19: qty 1

## 2017-08-19 MED ORDER — KETOROLAC TROMETHAMINE 30 MG/ML IJ SOLN
15.0000 mg | Freq: Once | INTRAMUSCULAR | Status: AC
  Administered 2017-08-19: 15 mg via INTRAVENOUS
  Filled 2017-08-19: qty 1

## 2017-08-19 MED ORDER — IBUPROFEN 600 MG PO TABS: 600.0000 mg | ORAL_TABLET | Freq: Four times a day (QID) | ORAL | 0 refills | Status: DC | PRN

## 2017-08-19 MED ORDER — SODIUM CHLORIDE 0.9 % IV SOLN
INTRAVENOUS | Status: DC
  Administered 2017-08-19: 10 mL/h via INTRAVENOUS
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
## 2017-08-19 NOTE — ED Triage Notes (Signed)
Patient c/o midsternal chest pain that started this morning , +N/V, patient also reports left sided arm and leg numbness and tingling. Patient denies chest pain at this time. Denies SOB. [place] negative. Denies any significant medical history.

## 2017-12-02 ENCOUNTER — Emergency Department (HOSPITAL_COMMUNITY)
Admission: EM | Admit: 2017-12-02 | Discharge: 2017-12-03 | Disposition: A | Discharge status: Home / Self Care | Payer: 59 | Attending: Emergency Medicine | Admitting: Emergency Medicine

## 2017-12-02 ENCOUNTER — Encounter (HOSPITAL_COMMUNITY): Payer: Self-pay

## 2017-12-02 ENCOUNTER — Other Ambulatory Visit: Payer: Self-pay

## 2017-12-02 DIAGNOSIS — N12 Tubulo-interstitial nephritis, not specified as acute or chronic: Secondary | ICD-10-CM

## 2017-12-02 DIAGNOSIS — R1084 Generalized abdominal pain: Secondary | ICD-10-CM | POA: Diagnosis present

## 2017-12-02 DIAGNOSIS — N10 Acute tubulo-interstitial nephritis: Secondary | ICD-10-CM | POA: Diagnosis not present

## 2017-12-02 DIAGNOSIS — R109 Unspecified abdominal pain: Secondary | ICD-10-CM

## 2017-12-02 LAB — URINALYSIS, ROUTINE W REFLEX MICROSCOPIC
BILIRUBIN URINE: NEGATIVE
GLUCOSE, UA: NEGATIVE mg/dL
KETONES UR: NEGATIVE mg/dL
NITRITE: POSITIVE — AB
PROTEIN: NEGATIVE mg/dL
Specific Gravity, Urine: 1.014 (ref 1.005–1.030)
pH: 6 (ref 5.0–8.0)

## 2017-12-02 LAB — CBC
HEMATOCRIT: 38 % (ref 36.0–46.0)
Hemoglobin: 11.9 g/dL — ABNORMAL LOW (ref 12.0–15.0)
MCH: 26.9 pg (ref 26.0–34.0)
MCHC: 31.3 g/dL (ref 30.0–36.0)
MCV: 85.8 fL (ref 80.0–100.0)
NRBC: 0 % (ref 0.0–0.2)
PLATELETS: 379 10*3/uL (ref 150–400)
RBC: 4.43 MIL/uL (ref 3.87–5.11)
RDW: 13.5 % (ref 11.5–15.5)
WBC: 7.7 10*3/uL (ref 4.0–10.5)

## 2017-12-02 LAB — I-STAT BETA HCG BLOOD, ED (MC, WL, AP ONLY): I-stat hCG, quantitative: <5 m[IU]/mL (ref <5)

## 2017-12-02 LAB — COMPREHENSIVE METABOLIC PANEL
ALT: 14 U/L (ref 0–44)
AST: 17 U/L (ref 15–41)
Albumin: 3.3 g/dL — ABNORMAL LOW (ref 3.5–5.0)
Alkaline Phosphatase: 72 U/L (ref 38–126)
Anion gap: 9 (ref 5–15)
BILIRUBIN TOTAL: 0.5 mg/dL (ref 0.3–1.2)
BUN: 8 mg/dL (ref 6–20)
CHLORIDE: 107 mmol/L (ref 98–111)
CO2: 27 mmol/L (ref 22–32)
CREATININE: 1.05 mg/dL — AB (ref 0.44–1.00)
Calcium: 9.1 mg/dL (ref 8.9–10.3)
GFR calc Af Amer: >60 mL/min (ref >60)
Glucose, Bld: 87 mg/dL (ref 70–99)
Potassium: 3.7 mmol/L (ref 3.5–5.1)
Sodium: 143 mmol/L (ref 135–145)
TOTAL PROTEIN: 7.9 g/dL (ref 6.5–8.1)

## 2017-12-02 LAB — LIPASE, BLOOD: LIPASE: 30 U/L (ref 11–51)

## 2017-12-02 MED ORDER — SODIUM CHLORIDE 0.9 % IV SOLN
1.0000 g | Freq: Once | INTRAVENOUS | Status: AC
  Administered 2017-12-02: 1 g via INTRAVENOUS
  Filled 2017-12-02: qty 10

## 2017-12-02 MED ORDER — ONDANSETRON HCL 4 MG/2ML IJ SOLN
4.0000 mg | Freq: Once | INTRAMUSCULAR | Status: AC
  Administered 2017-12-02: 4 mg via INTRAVENOUS
  Filled 2017-12-02: qty 2

## 2017-12-02 MED ORDER — KETOROLAC TROMETHAMINE 30 MG/ML IJ SOLN
30.0000 mg | Freq: Once | INTRAMUSCULAR | Status: AC
  Administered 2017-12-02: 30 mg via INTRAVENOUS
  Filled 2017-12-02: qty 1

## 2017-12-02 MED ORDER — SODIUM CHLORIDE 0.9 % IV BOLUS
1000.0000 mL | Freq: Once | INTRAVENOUS | Status: AC
  Administered 2017-12-02: 1000 mL via INTRAVENOUS

## 2017-12-02 NOTE — ED Triage Notes (Addendum)
Pt reports lower back and L side pain since early last week. She also reports fever, chills, and nausea at home. She states that he back pain improves with heat and pressure. Denies dysuria. A&Ox4.
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
## 2017-12-03 MED ORDER — CEPHALEXIN 500 MG PO CAPS: 500.0000 mg | ORAL_CAPSULE | Freq: Three times a day (TID) | ORAL | 0 refills | Status: DC

## 2017-12-03 MED ORDER — IBUPROFEN 800 MG PO TABS: 800.0000 mg | ORAL_TABLET | Freq: Three times a day (TID) | ORAL | 0 refills | Status: AC

## 2017-12-03 MED ORDER — ONDANSETRON 4 MG PO TBDP: 4.0000 mg | ORAL_TABLET | Freq: Three times a day (TID) | ORAL | 0 refills | Status: DC | PRN

## 2017-12-03 NOTE — Discharge Instructions (Signed)
Take the prescribed medication as directed.  Make sure to drink lots of fluids. Urine test was sent for culture, we will call you if any changes need to be made to your antibiotics. Follow-up with your primary care doctor. Return to the ED for new or worsening symptoms.

## 2017-12-05 LAB — URINE CULTURE

## 2017-12-06 ENCOUNTER — Telehealth: Payer: Self-pay

## 2017-12-06 NOTE — Telephone Encounter (Signed)
Post ED Visit - Positive Culture Follow-up  Culture report reviewed by antimicrobial stewardship pharmacist:  []  Enzo Bi, Pharm.D. []  Celedonio Miyamoto, Pharm.D., BCPS AQ-ID []  Garvin Fila, Pharm.D., BCPS []  Georgina Pillion, Pharm.D., BCPS []  [place], [address].D., BCPS, AAHIVP []  Estella Husk, Pharm.D., BCPS, AAHIVP []  Lysle Pearl, PharmD, BCPS []  Phillips Climes, PharmD, BCPS []  Agapito Games, PharmD, BCPS [x]  Verlan Friends, PharmD  Positive urine culture Treated with Cephalexin, organism sensitive to the same and no further patient follow-up is required at this time.  Jerry Caras 12/06/2017, 10:55 AM

## 2018-03-01 ENCOUNTER — Emergency Department (HOSPITAL_COMMUNITY)
Admission: EM | Admit: 2018-03-01 | Discharge: 2018-03-01 | Disposition: A | Discharge status: Home / Self Care | Payer: 59 | Attending: Emergency Medicine | Admitting: Emergency Medicine

## 2018-03-01 DIAGNOSIS — J00 Acute nasopharyngitis [common cold]: Secondary | ICD-10-CM | POA: Diagnosis not present

## 2018-03-01 DIAGNOSIS — R070 Pain in throat: Secondary | ICD-10-CM | POA: Diagnosis present

## 2018-03-01 DIAGNOSIS — M7918 Myalgia, other site: Secondary | ICD-10-CM | POA: Diagnosis not present

## 2018-03-01 DIAGNOSIS — Z79899 Other long term (current) drug therapy: Secondary | ICD-10-CM | POA: Diagnosis not present

## 2018-03-01 MED ORDER — BENZONATATE 100 MG PO CAPS: 100.0000 mg | ORAL_CAPSULE | Freq: Three times a day (TID) | ORAL | 0 refills | Status: AC

## 2018-03-01 MED ORDER — ONDANSETRON 4 MG PO TBDP: ORAL_TABLET | ORAL | 0 refills | Status: AC

## 2018-03-01 NOTE — ED Triage Notes (Signed)
Pt reports congestions, sore throat, body aches, and fever at home x3 days. Pt reports her highest fever was 103  Pt denies productive cough

## 2018-03-01 NOTE — ED Notes (Signed)
Bed: [facility] Expected date:  Expected time:  Means of arrival:  Comments: 
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
## 2018-03-01 NOTE — Discharge Instructions (Signed)
Take tylenol 2 pills 4 times a day and motrin 4 pills 3 times a day.  Drink plenty of fluids.  Return for worsening shortness of breath, headache, confusion. Follow up with your family doctor.   

## 2019-01-01 ENCOUNTER — Emergency Department (HOSPITAL_COMMUNITY)
Admission: EM | Admit: 2019-01-01 | Discharge: 2019-01-02 | Disposition: A | Discharge status: Home / Self Care | Payer: 59 | Attending: Emergency Medicine | Admitting: Emergency Medicine

## 2019-01-01 ENCOUNTER — Encounter (HOSPITAL_COMMUNITY): Payer: Self-pay | Admitting: Emergency Medicine

## 2019-01-01 ENCOUNTER — Other Ambulatory Visit: Payer: Self-pay

## 2019-01-01 DIAGNOSIS — R3911 Hesitancy of micturition: Secondary | ICD-10-CM | POA: Insufficient documentation

## 2019-01-01 DIAGNOSIS — R300 Dysuria: Secondary | ICD-10-CM | POA: Diagnosis present

## 2019-01-01 DIAGNOSIS — R102 Pelvic and perineal pain: Secondary | ICD-10-CM | POA: Diagnosis not present

## 2019-01-01 DIAGNOSIS — N3000 Acute cystitis without hematuria: Secondary | ICD-10-CM | POA: Diagnosis not present

## 2019-01-01 DIAGNOSIS — R350 Frequency of micturition: Secondary | ICD-10-CM | POA: Insufficient documentation

## 2019-01-01 NOTE — ED Triage Notes (Signed)
Patient reports urinary frequency/dysuria and bladder pressure/discomfort onset this evening , denies hematuria , no fever or chills.

## 2019-01-02 LAB — URINALYSIS, ROUTINE W REFLEX MICROSCOPIC
Bilirubin Urine: NEGATIVE
Glucose, UA: NEGATIVE mg/dL
Ketones, ur: NEGATIVE mg/dL
Nitrite: NEGATIVE
Protein, ur: 100 mg/dL — AB
Specific Gravity, Urine: 1.025 (ref 1.005–1.030)
WBC, UA: >50 WBC/hpf — ABNORMAL HIGH (ref 0–5)
pH: 6 (ref 5.0–8.0)

## 2019-01-02 LAB — BASIC METABOLIC PANEL
Anion gap: 8 (ref 5–15)
BUN: 9 mg/dL (ref 6–20)
CO2: 27 mmol/L (ref 22–32)
Calcium: 9.1 mg/dL (ref 8.9–10.3)
Chloride: 106 mmol/L (ref 98–111)
Creatinine, Ser: 0.89 mg/dL (ref 0.44–1.00)
GFR calc Af Amer: >60 mL/min (ref >60)
GFR calc non Af Amer: >60 mL/min (ref >60)
Glucose, Bld: 97 mg/dL (ref 70–99)
Potassium: 3.6 mmol/L (ref 3.5–5.1)
Sodium: 141 mmol/L (ref 135–145)

## 2019-01-02 LAB — CBC WITH DIFFERENTIAL/PLATELET
Abs Immature Granulocytes: 0.03 10*3/uL (ref 0.00–0.07)
Basophils Absolute: 0.1 10*3/uL (ref 0.0–0.1)
Basophils Relative: 1 %
Eosinophils Absolute: 0.1 10*3/uL (ref 0.0–0.5)
Eosinophils Relative: 1 %
HCT: 40.6 % (ref 36.0–46.0)
Hemoglobin: 12.9 g/dL (ref 12.0–15.0)
Immature Granulocytes: 0 %
Lymphocytes Relative: 39 %
Lymphs Abs: 3.2 10*3/uL (ref 0.7–4.0)
MCH: 27.6 pg (ref 26.0–34.0)
MCHC: 31.8 g/dL (ref 30.0–36.0)
MCV: 86.8 fL (ref 80.0–100.0)
Monocytes Absolute: 0.7 10*3/uL (ref 0.1–1.0)
Monocytes Relative: 9 %
Neutro Abs: 4.2 10*3/uL (ref 1.7–7.7)
Neutrophils Relative %: 50 %
Platelets: 315 10*3/uL (ref 150–400)
RBC: 4.68 MIL/uL (ref 3.87–5.11)
RDW: 13.2 % (ref 11.5–15.5)
WBC: 8.3 10*3/uL (ref 4.0–10.5)
nRBC: 0 % (ref 0.0–0.2)

## 2019-01-02 LAB — I-STAT BETA HCG BLOOD, ED (MC, WL, AP ONLY): I-stat hCG, quantitative: <5 m[IU]/mL (ref <5)

## 2019-01-02 MED ORDER — ACETAMINOPHEN 500 MG PO TABS
1000.0000 mg | ORAL_TABLET | Freq: Once | ORAL | Status: AC
  Administered 2019-01-02: 1000 mg via ORAL
  Filled 2019-01-02: qty 2

## 2019-01-02 MED ORDER — PHENAZOPYRIDINE HCL 200 MG PO TABS: 200.0000 mg | ORAL_TABLET | Freq: Three times a day (TID) | ORAL | 0 refills | Status: AC

## 2019-01-02 MED ORDER — PHENAZOPYRIDINE HCL 100 MG PO TABS
100.0000 mg | ORAL_TABLET | Freq: Once | ORAL | Status: AC
  Administered 2019-01-02: 02:00:00 100 mg via ORAL
  Filled 2019-01-02: qty 1

## 2019-01-02 MED ORDER — CEPHALEXIN 500 MG PO CAPS: 500.0000 mg | ORAL_CAPSULE | Freq: Two times a day (BID) | ORAL | 0 refills | Status: AC

## 2019-01-02 MED ORDER — CEPHALEXIN 500 MG PO CAPS
500.0000 mg | ORAL_CAPSULE | Freq: Once | ORAL | Status: AC
  Administered 2019-01-02: 500 mg via ORAL
  Filled 2019-01-02: qty 1

## 2019-01-02 NOTE — Discharge Instructions (Signed)
Take Keflex and Pyridium as prescribed until finished.  You may use Tylenol or ibuprofen for management of any pain or discomfort.  Follow-up with your primary care doctor to ensure resolution of your urinary tract infection.  You may return for any new or concerning symptoms.
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
## 2019-01-02 NOTE — ED Notes (Signed)
ED provider at bedside.

## 2019-01-04 LAB — URINE CULTURE: Culture: >=100000 — AB

## 2019-01-05 ENCOUNTER — Telehealth: Payer: Self-pay | Admitting: Emergency Medicine

## 2019-01-05 NOTE — Telephone Encounter (Signed)
Post ED Visit - Positive Culture Follow-up  Culture report reviewed by antimicrobial stewardship pharmacist: [place] Team []  Elenor Quinones, Pharm.D. []  Heide Guile, Pharm.D., BCPS AQ-ID []  Parks Neptune, Pharm.D., BCPS [x]  Alycia Rossetti, Pharm.D., BCPS []  [place], Pharm.D., BCPS, AAHIVP []  Legrand Como, Pharm.D., BCPS, AAHIVP []  Salome Arnt, PharmD, BCPS []  Johnnette Gourd, PharmD, BCPS []  Hughes Better, PharmD, BCPS []  Leeroy Cha, PharmD []  Laqueta Linden, PharmD, BCPS []  Albertina Parr, PharmD  [place] Team []  Leodis Sias, PharmD []  Lindell Spar, PharmD []  Royetta Asal, PharmD []  Graylin Shiver, Rph []  Rema Fendt) Glennon Mac, PharmD []  Arlyn Dunning, PharmD []  Netta Cedars, PharmD []  Dia Sitter, PharmD []  Leone Haven, PharmD []  Gretta Arab, PharmD []  Theodis Shove, PharmD []  Peggyann Juba, PharmD []  Reuel Boom, PharmD   Positive urine culture Treated with cephalexin, organism sensitive to the same and no further patient follow-up is required at this time.  Hazle Nordmann 01/05/2019, 11:03 AM

## 2019-05-27 ENCOUNTER — Encounter (HOSPITAL_COMMUNITY): Payer: Self-pay

## 2019-05-27 ENCOUNTER — Other Ambulatory Visit: Payer: Self-pay

## 2019-05-27 ENCOUNTER — Ambulatory Visit (HOSPITAL_COMMUNITY)
Admission: EM | Admit: 2019-05-27 | Discharge: 2019-05-27 | Disposition: A | Discharge status: Home / Self Care | Payer: 59 | Attending: Family Medicine | Admitting: Family Medicine

## 2019-05-27 DIAGNOSIS — R21 Rash and other nonspecific skin eruption: Secondary | ICD-10-CM

## 2019-05-27 MED ORDER — PREDNISONE 10 MG (21) PO TBPK: ORAL_TABLET | Freq: Every day | ORAL | 0 refills | Status: AC

## 2019-05-27 NOTE — ED Triage Notes (Signed)
Pt present she was bitten by some black ants and now a rash has appeared, this happen a week ago. The rash is spreading with itching and burning.
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
## 2019-06-18 ENCOUNTER — Ambulatory Visit (HOSPITAL_COMMUNITY)
Admission: EM | Admit: 2019-06-18 | Discharge: 2019-06-18 | Disposition: A | Discharge status: Home / Self Care | Payer: 59 | Attending: Family Medicine | Admitting: Family Medicine

## 2019-06-18 ENCOUNTER — Encounter (HOSPITAL_COMMUNITY): Payer: Self-pay

## 2019-06-18 ENCOUNTER — Other Ambulatory Visit: Payer: Self-pay

## 2019-06-18 DIAGNOSIS — L209 Atopic dermatitis, unspecified: Secondary | ICD-10-CM

## 2019-06-18 MED ORDER — FLUOCINONIDE 0.05 % EX OINT: 1.0000 "application " | TOPICAL_OINTMENT | Freq: Two times a day (BID) | CUTANEOUS | 0 refills | Status: AC

## 2019-06-18 NOTE — ED Triage Notes (Signed)
Patient following up for insect bites on her right arm. States they have not gotten any better since previous visit.
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
## 2020-01-13 IMAGING — CR DG CHEST 2V
2 series · 2 of 2 positions shown · non-contrast
Comparison: 08/08/2015

CLINICAL DATA: Short of breath

EXAM:
CHEST - 2 VIEW

[w chest pa]
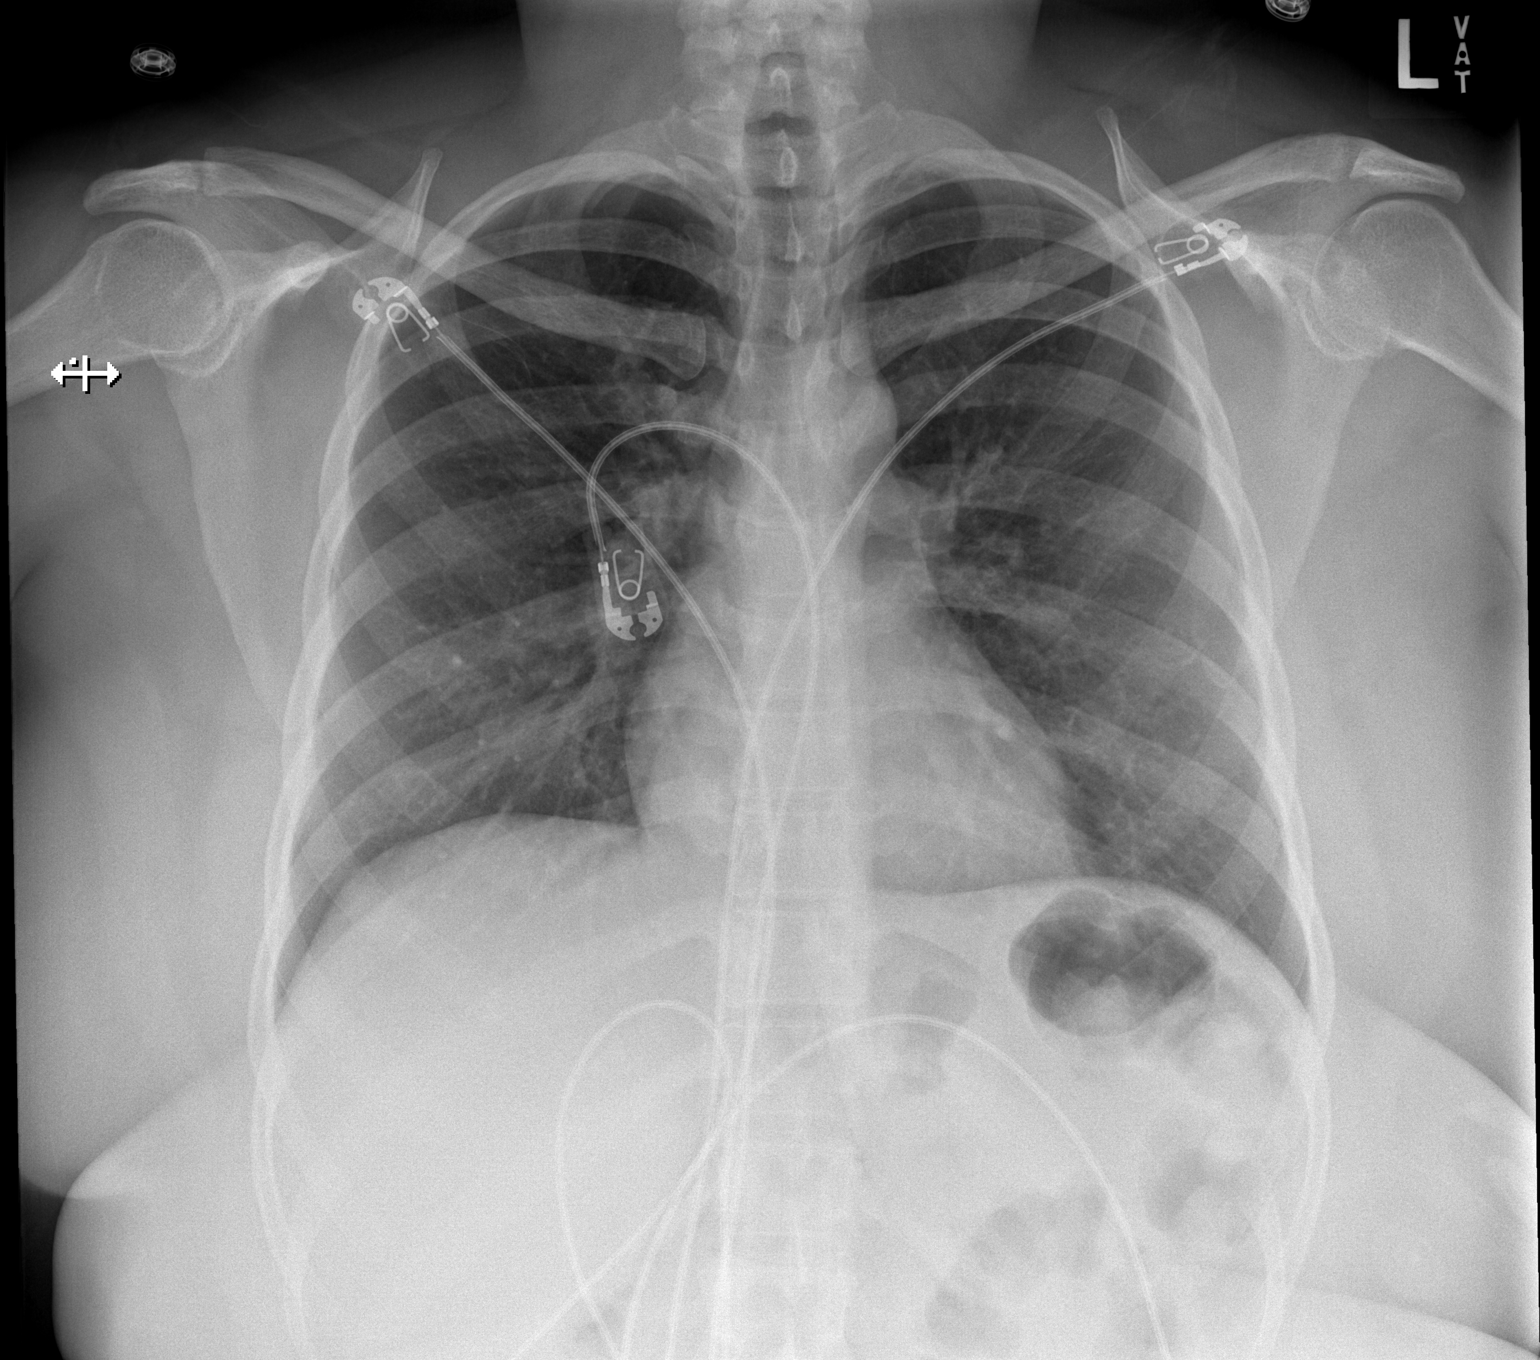

[w chest lat]
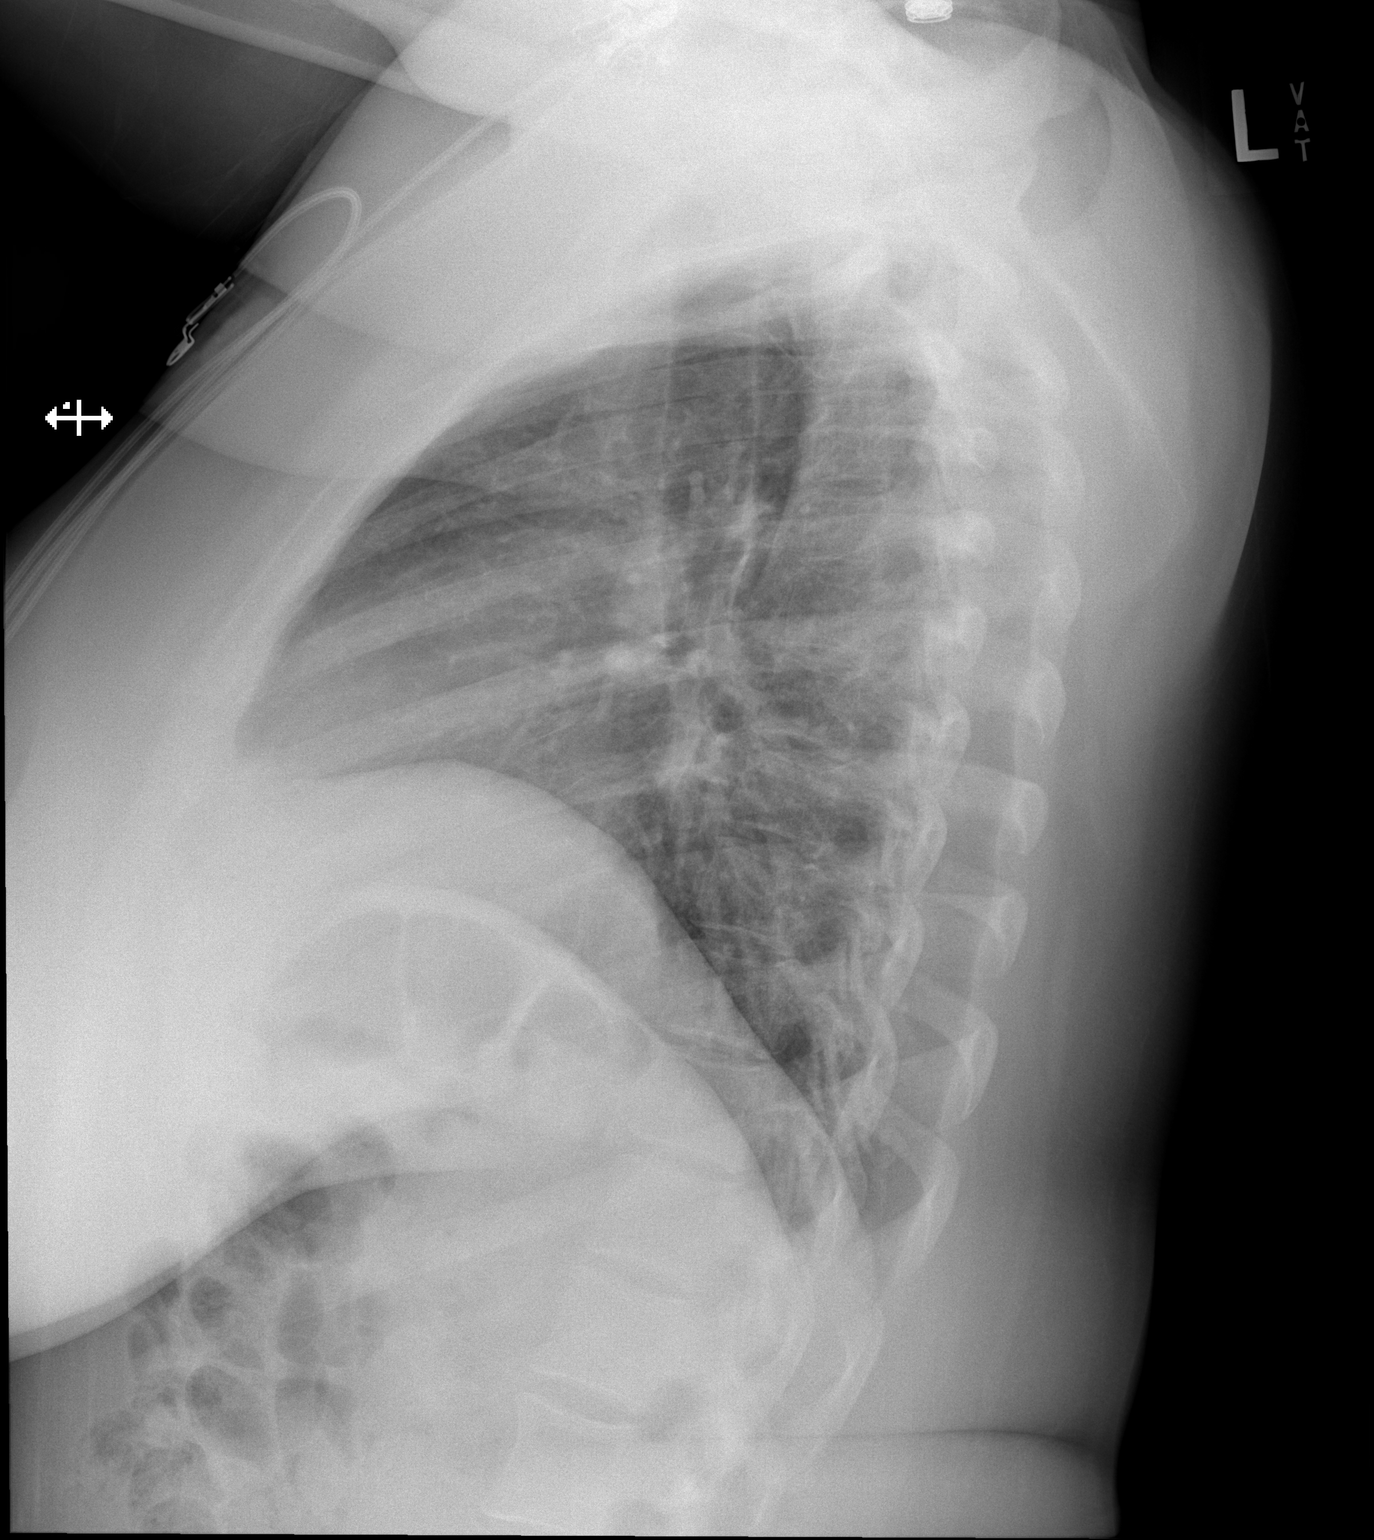

[2 of 2 positions shown; findings below may reference images not displayed]

FINDINGS: Interval resolution of right lower lobe atelectasis. Lungs are now
clear. Negative for heart failure or effusion.
IMPRESSION: No active cardiopulmonary disease.

## 2020-02-07 ENCOUNTER — Other Ambulatory Visit: Payer: 59

## 2023-01-13 ENCOUNTER — Emergency Department (HOSPITAL_COMMUNITY)
Admission: EM | Admit: 2023-01-13 | Discharge: 2023-01-13 | Disposition: A | Discharge status: Home / Self Care | Payer: No Typology Code available for payment source | Attending: Emergency Medicine | Admitting: Emergency Medicine

## 2023-01-13 ENCOUNTER — Encounter (HOSPITAL_COMMUNITY): Payer: Self-pay | Admitting: Emergency Medicine

## 2023-01-13 ENCOUNTER — Other Ambulatory Visit: Payer: Self-pay

## 2023-01-13 DIAGNOSIS — R21 Rash and other nonspecific skin eruption: Secondary | ICD-10-CM | POA: Diagnosis present

## 2023-01-13 DIAGNOSIS — L304 Erythema intertrigo: Secondary | ICD-10-CM | POA: Insufficient documentation

## 2023-01-13 MED ORDER — CLOTRIMAZOLE 1 % EX CREA: TOPICAL_CREAM | CUTANEOUS | 0 refills | Status: AC

## 2023-01-13 NOTE — ED Triage Notes (Signed)
Patient arrives ambulatory by POV c/o burning pain, itching and irritation to skin underneath breast and between breast x 2 days. Patient reports sweating before it began.

## 2023-01-13 NOTE — ED Provider Notes (Signed)
[name] AT [facility] Provider Note   CSN: [id number] Arrival date & time: 01/13/23  6387     History  Chief Complaint  Patient presents with   Breast Pain    Chelsey Wells is a 42 y.o. female Patient presents to the ED with chief complaint of a breast rash that started 2 days ago on the under surface of her bilateral breasts. 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 She reports other instances of sweating, but has never developed a rash afterwards. She describes it as a pruritic, burning rash that has a disturbing odor. She reports no new rash to any other places on her body or experiencing anything like this before. She has tried hydrocortisone cream and cool compresses at home with minimal relief. She denies any changes in laundry detergents, soaps, lotions or perfumes. Denies fever, N/V, chills, drainage.   HPI     Home Medications Prior to Admission medications   Medication Sig Start Date End Date Taking? Authorizing Provider  clotrimazole (LOTRIMIN) 1 % cream Apply to affected area 2 times daily for 1-2 weeks 01/13/23  Yes Sherma Vanmetre H, PA-C  benzonatate (TESSALON) 100 MG capsule Take 1 capsule (100 mg total) by mouth every 8 (eight) hours. 03/01/18   Melene Plan, DO  cephALEXin (KEFLEX) 500 MG capsule Take 1 capsule (500 mg total) by mouth 2 (two) times daily. 01/02/19   Antony Madura, PA-C  fluocinonide ointment (LIDEX) 0.05 % Apply 1 application topically 2 (two) times daily. 06/18/19   Mardella Layman, MD  ibuprofen (ADVIL,MOTRIN) 800 MG tablet Take 1 tablet (800 mg total) by mouth 3 (three) times daily. 12/03/17   Garlon Hatchet, PA-C  lidocaine (XYLOCAINE) 2 % solution Use as directed 20 mLs in the mouth or throat as needed for mouth pain. Patient not taking: Reported on 08/19/2017 03/16/17   Caccavale,  Sophia, PA-C  ondansetron (ZOFRAN ODT) 4 MG disintegrating tablet 4mg  ODT q4 hours prn nausea/vomit 03/01/18   Melene Plan, DO  oseltamivir (TAMIFLU) 75 MG capsule Take 1 capsule (75 mg total) by mouth every 12 (twelve) hours. Patient not taking: Reported on 08/19/2017 04/10/16   Deatra Canter, FNP  phenazopyridine (PYRIDIUM) 200 MG tablet Take 1 tablet (200 mg total) by mouth 3 (three) times daily. 01/02/19   Antony Madura, PA-C  predniSONE (STERAPRED UNI-PAK 21 TAB) 10 MG (21) TBPK tablet Take by mouth daily. Take as directed. 05/27/19   Mardella Layman, MD      Allergies    Patient has no known allergies.    Review of Systems   Review of Systems  All other systems reviewed and are negative.   Physical Exam Updated Vital Signs BP 122/64 (BP Location: Right Arm)   Pulse 91   Temp 98.3 F (36.8 C) (Oral)   Resp 14   Ht 4\' 10"  (1.473 m)   Wt 87.1 kg   LMP 01/09/2023   SpO2 98%   BMI 40.13 kg/m  Physical Exam Vitals and nursing note reviewed.  Constitutional:      General: She is not in acute distress.    Appearance: Normal appearance.  HENT:     Head: Normocephalic and atraumatic.  Eyes:     General:        Right eye: No discharge.  Left eye: No discharge.  Cardiovascular:     Rate and Rhythm: Normal rate and regular rhythm.  Pulmonary:     Effort: Pulmonary effort is normal. No respiratory distress.  Musculoskeletal:        General: No deformity.  Skin:    General: Skin is warm and dry.     Comments: Red maculopapular rash underneath the breasts without significant skin breakdown, no sign of secondary infection, some satellite lesions noted  Neurological:     Mental Status: She is alert and oriented to person, place, and time.  Psychiatric:        Mood and Affect: Mood normal.        Behavior: Behavior normal.     ED Results / Procedures / Treatments   Labs (all labs ordered are listed, but only abnormal results are displayed) Labs Reviewed - No data to  display  EKG None  Radiology No results found.  Procedures Procedures    Medications Ordered in ED Medications - No data to display  ED Course/ Medical Decision Making/ A&P                                 Medical Decision Making  This patient is a 42 y.o. female who presents to the ED for concern of rash under breasts.   Differential diagnoses prior to evaluation: Cellulitis, abscess, intertrigo, fungal infection, versus other  Past Medical History / Social History / Additional history: Chart reviewed. Pertinent results include: Overall noncontributory  Physical Exam: Physical exam performed. The pertinent findings include: Patient has red, irritated maculopapular rash underneath the breasts, some satellite lesions noted, consistent with intertrigo  Medications / Treatment: Encouraged clotrimazole, Desitin, and discontinuation of hydrocortisone, patient understands and agrees to plan   Disposition: After consideration of the diagnostic results and the patients response to treatment, I feel that patient stable for discharge with plan as above.   emergency department workup does not suggest an emergent condition requiring admission or immediate intervention beyond what has been performed at this time. The plan is: as above. The patient is safe for discharge and has been instructed to return immediately for worsening symptoms, change in symptoms or any other concerns.  Final Clinical Impression(s) / ED Diagnoses Final diagnoses:  Intertrigo    Rx / DC Orders ED Discharge Orders          Ordered    clotrimazole (LOTRIMIN) 1 % cream        01/13/23 1208              Olene Floss, PA-C 01/13/23 1217    Alvira Monday, MD 01/14/23 2307

## 2023-01-13 NOTE — Discharge Instructions (Signed)
Use the prescription clotrimazole cream twice daily on all affected skin surfaces under the breast, I would recommend using a barrier cream such as Desitin additionally to help reduce friction while the rash is healing.  I would use the antifungal cream for at least 1 to 2 weeks, but you can extend out the usage if the rash is lingering.  Please follow-up with your primary care or dermatologist if you continue to have symptoms despite treatment.
